# Patient Record
Sex: Female | Born: 1965 | Race: Black or African American | Hispanic: No | Marital: Single | Smoking: Never smoker
Health system: Southern US, Community
[De-identification: ages and names within clinical notes are randomized; demographics above are authoritative.]

## PROBLEM LIST (undated history)

## (undated) DIAGNOSIS — K219 Gastro-esophageal reflux disease without esophagitis: Secondary | ICD-10-CM

## (undated) DIAGNOSIS — D259 Leiomyoma of uterus, unspecified: Secondary | ICD-10-CM

## (undated) DIAGNOSIS — M8580 Other specified disorders of bone density and structure, unspecified site: Secondary | ICD-10-CM

## (undated) DIAGNOSIS — J329 Chronic sinusitis, unspecified: Secondary | ICD-10-CM

## (undated) DIAGNOSIS — Z780 Asymptomatic menopausal state: Secondary | ICD-10-CM

## (undated) HISTORY — DX: Asymptomatic menopausal state: Z78.0

## (undated) HISTORY — PX: TOTAL ABDOMINAL HYSTERECTOMY: SHX209

## (undated) HISTORY — DX: Other specified disorders of bone density and structure, unspecified site: M85.80

## (undated) HISTORY — PX: BILATERAL SALPINGOOPHORECTOMY: SHX1223

## (undated) HISTORY — DX: Leiomyoma of uterus, unspecified: D25.9

## (undated) HISTORY — DX: Chronic sinusitis, unspecified: J32.9

## (undated) HISTORY — DX: Gastro-esophageal reflux disease without esophagitis: K21.9

## (undated) HISTORY — PX: APPENDECTOMY: SHX54

## (undated) HISTORY — PX: BLADDER SURGERY: SHX569

---

## 2006-05-25 ENCOUNTER — Ambulatory Visit: Payer: Self-pay | Admitting: Family Medicine

## 2006-05-25 ENCOUNTER — Encounter: Payer: Self-pay | Admitting: Family Medicine

## 2006-07-20 ENCOUNTER — Ambulatory Visit: Payer: Self-pay | Admitting: Family Medicine

## 2007-03-17 ENCOUNTER — Ambulatory Visit: Payer: Self-pay | Admitting: Gastroenterology

## 2007-03-23 ENCOUNTER — Emergency Department: Payer: Self-pay | Admitting: Emergency Medicine

## 2007-03-24 ENCOUNTER — Ambulatory Visit: Payer: Self-pay | Admitting: Gastroenterology

## 2007-03-30 ENCOUNTER — Ambulatory Visit: Payer: Self-pay | Admitting: Gastroenterology

## 2007-05-30 ENCOUNTER — Ambulatory Visit: Payer: Self-pay | Admitting: Gynecology

## 2008-02-06 ENCOUNTER — Ambulatory Visit: Payer: Self-pay | Admitting: Gastroenterology

## 2008-04-06 ENCOUNTER — Ambulatory Visit: Payer: Self-pay | Admitting: Gastroenterology

## 2008-04-09 ENCOUNTER — Ambulatory Visit: Payer: Self-pay | Admitting: Gastroenterology

## 2008-04-19 ENCOUNTER — Ambulatory Visit: Payer: Self-pay | Admitting: Gastroenterology

## 2008-11-05 IMAGING — CT CT ABD-PELV W/ CM
1 of 3 series · 13 of 32 positions shown, 19 images · non-contrast
Comparison: none

REASON FOR EXAM: abdominal pain
COMMENTS:

[Series 2: soft tissue · axial · 0.63mm/px · z∈[-1274,-954]mm · 13 of 48 slices shown, 19 images]
[im 4/48  soft-tissue]
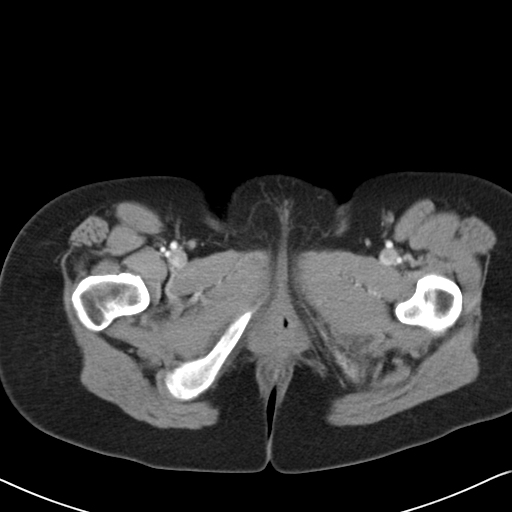
[im 4/48  bone]
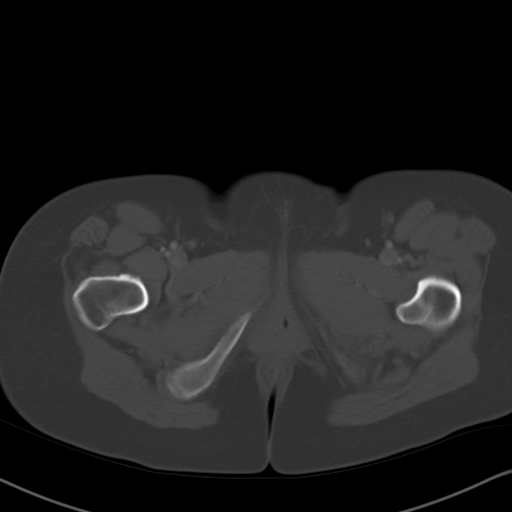
[im 7/48  soft-tissue]
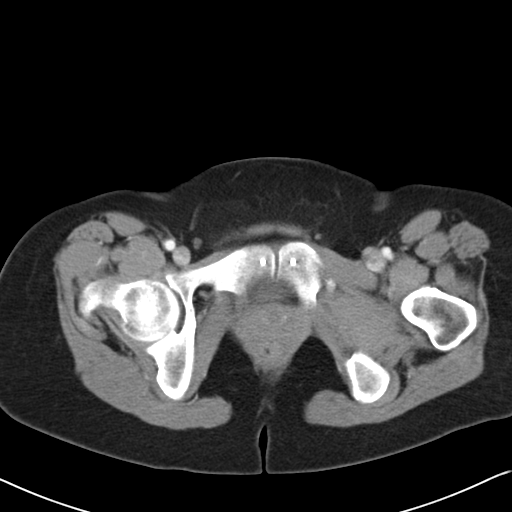
[im 10/48  soft-tissue]
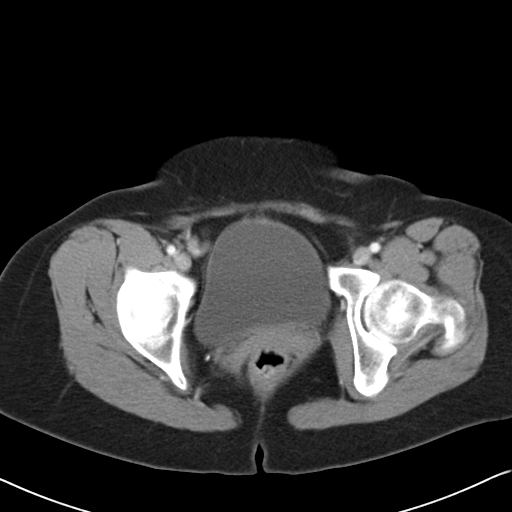
[im 13/48  soft-tissue]
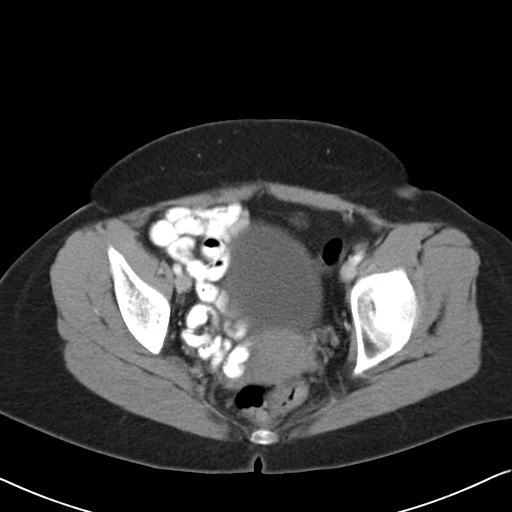
[im 16/48  soft-tissue]
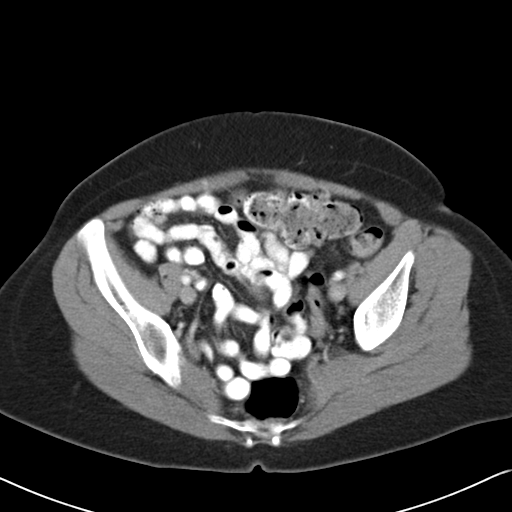
[im 19/48  soft-tissue]
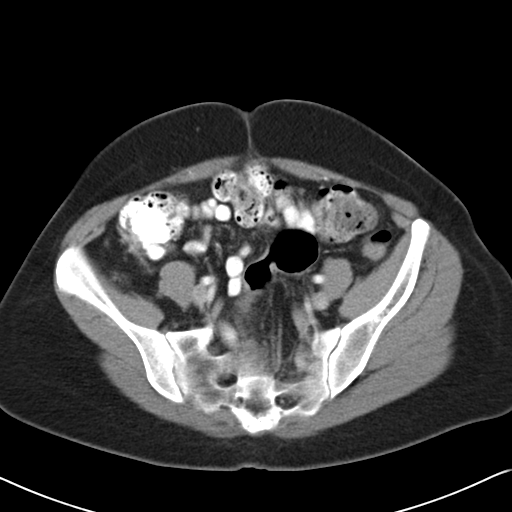
[im 26/48  soft-tissue]
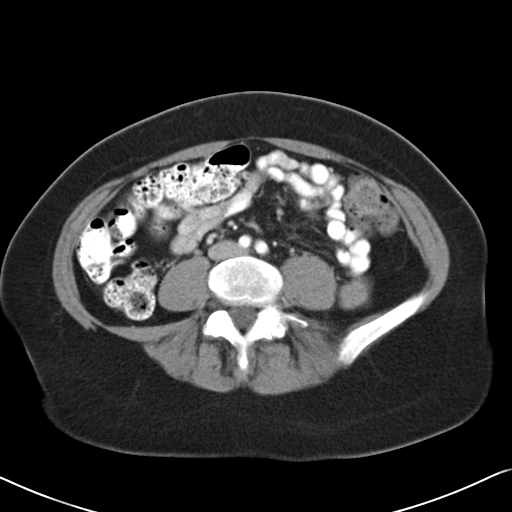
[im 29/48  soft-tissue]
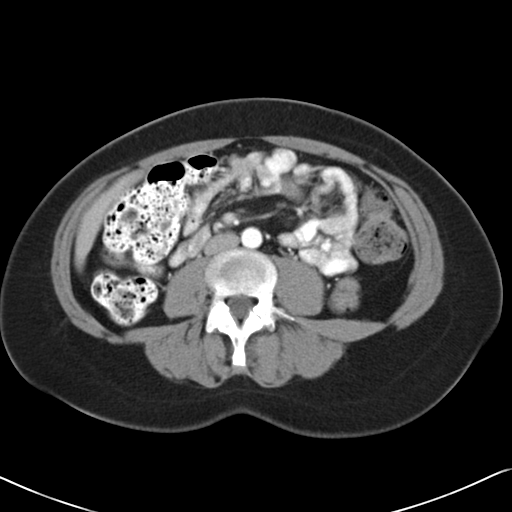
[im 32/48  soft-tissue]
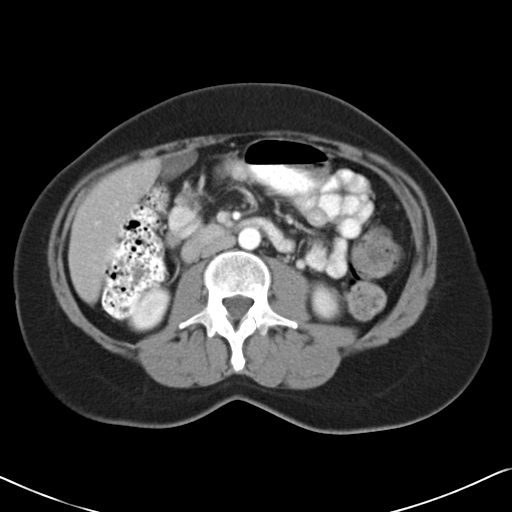
[im 32/48  bone]
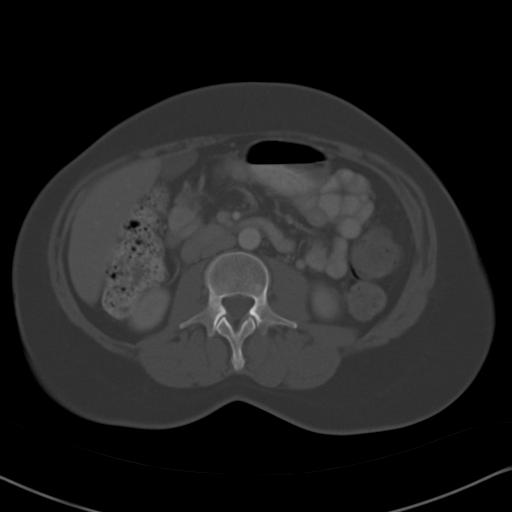
[im 35/48  soft-tissue]
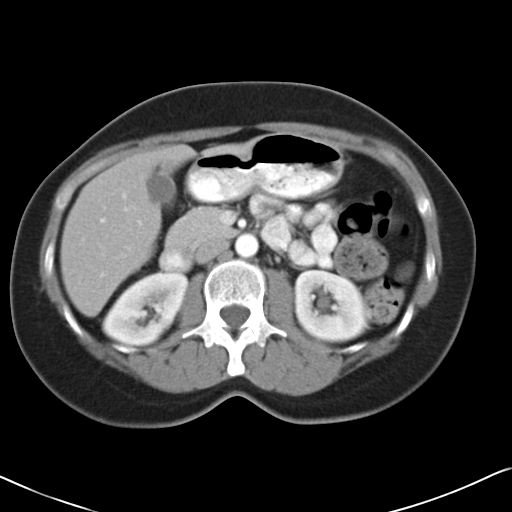
[im 35/48  lung]
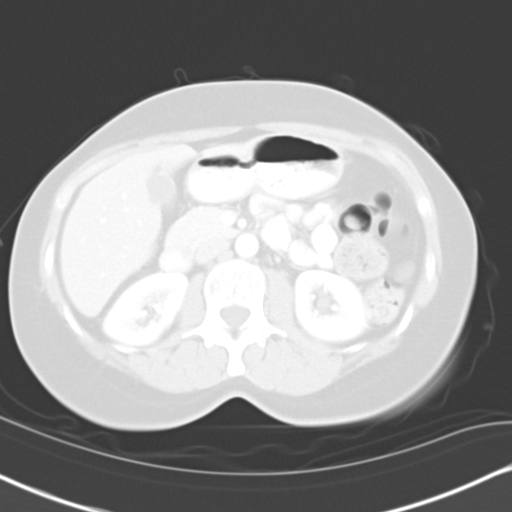
[im 38/48  soft-tissue]
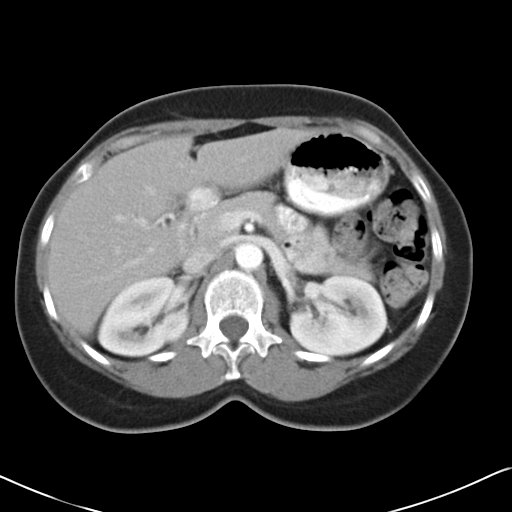
[im 38/48  lung]
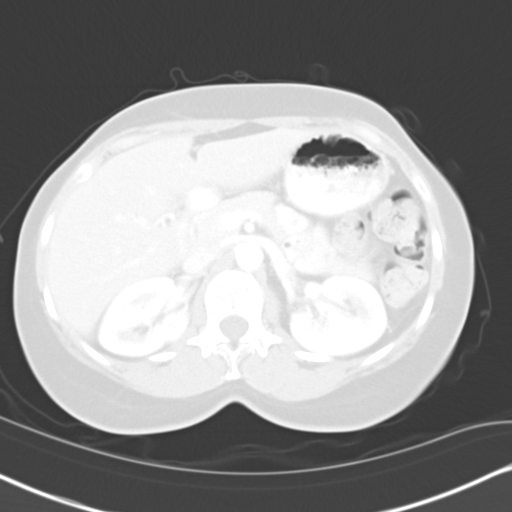
[im 41/48  soft-tissue]
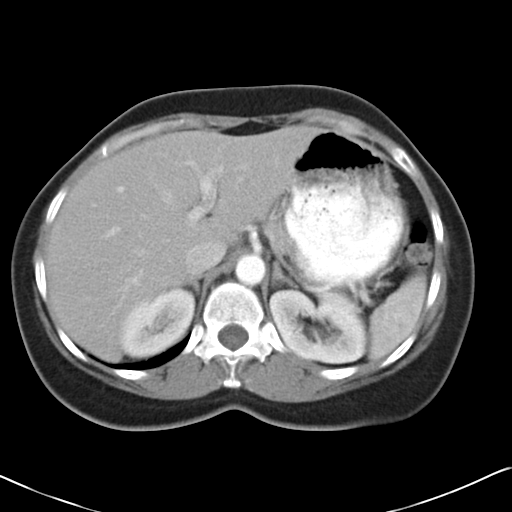
[im 41/48  lung]
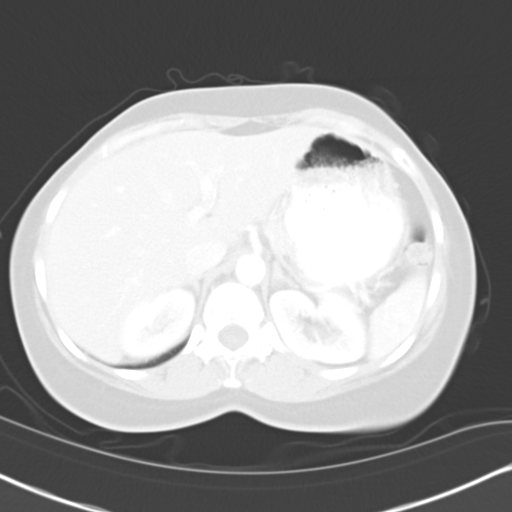
[im 44/48  soft-tissue]
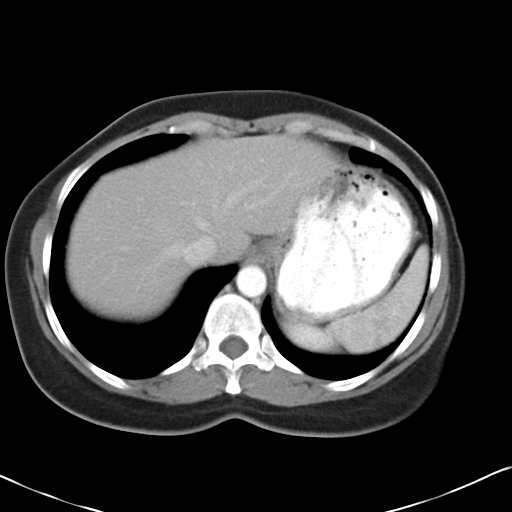
[im 44/48  lung]
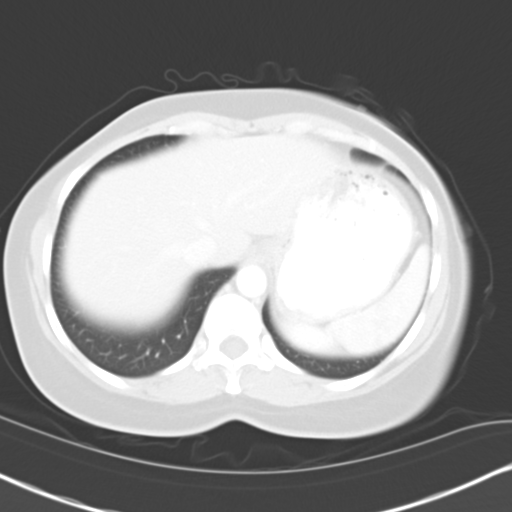

[13 of 32 positions shown; findings below may reference images not displayed]

PROCEDURE:     CT  - CT ABDOMEN / PELVIS  W  - March 17, 2007  [DATE]

RESULT:     CT of the abdomen and pelvis is performed utilizing 75 ml of
1sovue-KIN along with oral contrast. Images are reconstructed at 8 mm slice
thickness. The patient has no prior study for comparison. The patient has
been experiencing generalized abdominal pain with nausea. There is a history
of previous appendectomy and hysterectomy.

Low attenuation is seen in the RIGHT lobe of liver on image #7 with a
maximum measured dimension of 6.5 mm being present. Tiny density is seen in
the RIGHT lobe more posteriorly and medially near the RIGHT kidney upper
pole region and is even smaller and too small for accurate characterization.
Another low attenuation area is seen near the porta hepatis on image #10.
This area measures as much as 9 mm in length. Delayed images were obtained.
The low attenuation areas in the liver do not change and likely represent
simple hepatic cysts. The smaller areas are too small for accurate
characterization. The pancreas appears to be somewhat prominent. A discrete
pancreatic mass is not seen. Correlation with pancreatic enzymes is
recommended. This fullness extends into the pancreatic head. No evidence of
a definite parenchymal mass, cyst or pseudocyst is demonstrated. The
gallbladder, aorta, kidneys and spleen are within normal limits. The adrenal
glands are unremarkable. There is no abnormal bowel distention. There is no
free air or free fluid evident.
IMPRESSION: Presumed hepatic cysts. There is fullness diffusely of the
pancreas. If there is concern for pancreatic abnormality MRI could be
considered. There certainly could be some mild pancreatitis but no overt CT
evidence of acute pancreatitis is demonstrated.

## 2009-01-07 ENCOUNTER — Ambulatory Visit: Payer: Self-pay | Admitting: Obstetrics & Gynecology

## 2009-01-07 ENCOUNTER — Encounter: Payer: Self-pay | Admitting: Obstetrics & Gynecology

## 2009-06-10 ENCOUNTER — Ambulatory Visit: Payer: Self-pay | Admitting: Family Medicine

## 2009-11-29 ENCOUNTER — Ambulatory Visit: Payer: Self-pay

## 2010-07-01 ENCOUNTER — Ambulatory Visit: Payer: Self-pay | Admitting: Family Medicine

## 2010-07-02 ENCOUNTER — Ambulatory Visit: Payer: Self-pay

## 2010-11-11 NOTE — Assessment & Plan Note (Signed)
NAME:  ARLANDA, SHIPLETT NO.:  [id number]   MEDICAL RECORD NO.:  [id number]          PATIENT TYPE:  POB   LOCATION:  [facility] at [facility]         FACILITY:  [facility]   PHYSICIAN:  Jaynie Collins, MD     DATE OF BIRTH:  06/07/1966   DATE OF SERVICE:  01/07/2009                                  CLINIC NOTE   The patient is a 45 year old para 1, who comes in today for her yearly  examination.  The patient underwent hysterectomy in 2002 for fibroids  and at that point underwent a supracervical hysterectomy and bilateral  salpingo-oophorectomy.  She was also been diagnosed with osteoporosis on  a DEXA scan done in 2008, which showed osteoporosis of the lumbar spine  and osteopenia of the left femoral neck.  At her visit in January 2008,  she was prescribed Boniva 150 mg per month and also instructed on  weightbearing exercise and calcium and vitamin D.  The patient reports  that she had not been taking the Boniva, the calcium or the vitamin D  and does not exercise regularly.  She denies any gynecologic concerns.   PAST OBSTETRIC AND GYNECOLOGIC HISTORY:  The patient has had 1 cesarean  section.  She had a supracervical hysterectomy followed by a post-  hysterectomy infection that required a bilateral salpingo-oophorectomy.  The patient has had no bleeding, no sexually transmitted infections or  any other concerns.  Her last mammogram was in December 2007, which was  negative.   PAST MEDICAL HISTORY:  Gastroesophageal reflux disease and sinus  infections.   PAST SURGICAL HISTORY:  Supracervical hysterectomy and bilateral  salpingo-oophorectomy, bladder surgery, appendectomy.   MEDICATIONS:  1. Omeprazole 20 mg daily.  2. Cefdinir 300 mg b.i.d. for sinus infections.   ALLERGIES:  CODEINE, which causes GI upset.   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  She denies any tobacco, alcohol or drug use.  She denies any sexual or  physical abuse  history.   FAMILIAL HISTORY:  Notable for diabetes, hypertension, and elevated  cholesterol.   REVIEW OF SYSTEMS:  Entirely negative unless reported in the history of  present illness.   PHYSICAL EXAMINATION:  VITAL SIGNS:  Blood pressure is 111/73, pulse 84,  weight 138 pounds, height 5 feet 1 inches.  GENERAL:  No apparent distress.  HEENT:  Normocephalic, atraumatic.  NECK:  Supple with normal thyroid.  LUNGS:  Clear to auscultation bilaterally.  HEART:  Regular rate and rhythm.  BREASTS:  Symmetric in size.  No abnormal masses, tenderness, nipple  drainage or lymphadenopathy.  ABDOMEN:  Soft, nontender, nondistended, well-healed incisions.  No  masses.  No organomegaly.  EXTREMITIES:  No cyanosis, clubbing or edema.  PELVIC:  Normal external female genitalia.  Pink, well-rugated vagina.  Normal cervix.  Uterus and adnexa are surgically absent.  Bimanual exam  is normal.  Pap smear was obtained from the cervix.   ASSESSMENT AND PLAN:  The patient is a 45 year old para 1 with a history  of osteoporosis that is untreated, who comes in today for annual exam.  The patient had a normal breast examination, but we will  schedule a  mammogram.  She also underwent a Pap smear today.  We will follow up  results.  As given the patient has osteoporosis, she was strongly  advised to continue with her osteoporosis therapy with Boniva, which is  given monthly.  She was given a repeat prescription for this and also  told to continue calcium and vitamin D daily.  The patient is not having  any kind of menopausal symptoms and as well as not a candidate for  hormone replacement therapy.  At this point, she will have another DEXA  scan and we will see how her osteoporosis has progressed since her last  one in 2008.  The importance of adhering to osteoporosis therapy was  emphasized with the patient and risk of fracture if therapy is not  adhered to.  The patient was told to follow up for any  further concerns.            ______________________________  Jaynie Collins, MD     UA/MEDQ  D:  01/07/2009  T:  01/08/2009  Job:  [id number]

## 2010-11-14 NOTE — Assessment & Plan Note (Signed)
NAME:  NANCE, MCCOMBS NO.:  [id number]   MEDICAL RECORD NO.:  [id number]          PATIENT TYPE:  POB   LOCATION:  [facility]         FACILITY:  [facility]   PHYSICIAN:  Tinnie Gens, MD        DATE OF BIRTH:  [birth year]   DATE OF SERVICE:                                    CLINIC NOTE   CHIEF COMPLAINT:  Yearly exam.   HISTORY OF PRESENT ILLNESS:  The patient is a 45 year old para 1 who comes  in today for her yearly exam.  She is status post hysterectomy in 2002.  Her  last mammogram was in 2006.  She is without significant complaint today.  The patient underwent bone densitometry at work that showed a Z-score of -  1.5 and a T-score of -1.7.  She lost her ovaries after her hysterectomy in  2001 and has been menopausal for at least 6 years.  She is supposed to be on  Vivelle, which she does not take with any regularity.  Is also not taking  any vitamin D or calcium.   PAST MEDICAL HISTORY:  Negative.   PAST SURGICAL HISTORY:  She had a TAH/BSO, fibroids, bladder surgery, and an  appendectomy.   MEDICATIONS:  She is on Vivelle-Dot 0.1, which she takes irregularly.   ALLERGIES:  No known.   SOCIAL HISTORY:  No tobacco, alcohol, or drug use.  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   FAMILY HISTORY:  Diabetes in her father.  Hypertension.  Elevated  cholesterol in her mother.   OB HISTORY:  She is a para 1.  She had 1 C-section.   GYN HISTORY:  History of fibroid uterus, where she had a hysterectomy,  followed by post-hysterectomy infection that required a BSO.   REVIEW OF SYSTEMS:  Reviewed.  Positive for headaches.  She is not sure if  this is related to sinus problems, which are recurrent for her, or the fact  that she is having difficulty seeing up close any more.  She did report she  is seeing an eye doctor next month.  She denies shortness of breath, chest  pain, coughing, wheezing, nausea, vomiting, or diarrhea, constipation,  weight loss, weight gain, anorexia.  She reports being somewhat constipated  and would like to increase the fiber in her diet.  No blood in her stools.  No diarrhea.  No dysuria.  No hematuria.  No lower extremity swelling.  No  neurological issues.   EXAM:  Blood pressure is 103/62, weight 134, pulse 74.  She is a well-developed, well-nourished female in no acute distress.  NECK:  Supple with normal thyroid.  HEENT:  Sclerae anicteric.  LUNGS:  Clear bilaterally.  CV:  Regular rate and rhythm with no rubs, gallops, or murmurs.  ABDOMEN:  Soft, nontender, nondistended.  No masses.  No organomegaly.  EXTREMITIES:  No cyanosis, clubbing, or edema.  2+ distal pulses.  BREASTS:  Symmetric with everted nipples.  No masses.  No supraclavicular or  axillary adenopathy.  GU:  Normal external female genitalia.  Vagina is pink and rugae.  The  cervix is present and nulliparas  in nature.  The uterus and adnexa are  surgically absent.  BUS is normal.   IMPRESSION:  1. Yearly exam with Pap smear.  Given that the patient continues to have      cervix, still recommend yearly Pap smear.  If she goes 2 to 3 years      with normal Pap smear, she could switch to every 3 years.  2. Osteopenia.  3. Menopause.   PLAN:  1. Mammogram next summer.  Bone density test.  2. Refill Vivelle 0.1, take more regularly.  3. Calcium 1500 plus vitamin D 400 daily.  Using primrose oil for      occasional breast discomfort.  4. Recommend seeing her eye doctor.  5. Fibercon as needed for constipation.           ______________________________  Tinnie Gens, MD     TP/MEDQ  D:  05/25/2006  T:  05/25/2006  Job:  [id number]

## 2010-11-14 NOTE — Assessment & Plan Note (Signed)
NAME:  Debra Price, Debra Price NO.:  [id number]   MEDICAL RECORD NO.:  [id number]          PATIENT TYPE:  POB   LOCATION:  [facility] at [facility]         FACILITY:  [facility]   PHYSICIAN:  Tinnie Gens, MD        DATE OF BIRTH:  1965-12-21   DATE OF SERVICE:  07/20/2006                                  CLINIC NOTE   CHIEF COMPLAINT:  Followup.   HISTORY OF PRESENT ILLNESS:  Patient is a 45 year old lady who had a  DEXA scan at work and was found to have osteopenia.  She returns today  after results of her bone density scan have been completed.  This  reveals an osteoporosis of the lumbar spine, osteopenia of the left  femoral neck shows a T score of -3.06 and a Z score of -2.7 at the  lumbar spine, and a T score of -1.97 and a Z score of -1.76 at the left  femoral neck.  Patient comes in today to inquire about treatment.  She  is without significant complaint.  She did start on calcium and vitamin  D as instructed at her last visit.   PHYSICAL EXAMINATION:  On exam, her vitals are as noted on the chart.  She is a well-developed, well-nourished female in no acute distress.   IMPRESSION:  Osteoporosis and osteopenia.   PLAN:  1. Start Boniva 150 mg once monthly.  Instructions were given to the      patient regarding n.p.o. for 60 minutes after taking, and sitting      upright after taking the medication.  2. Followup bone density in approximately 12 months.  3. Instructed on weight-bearing exercise.  4. Continue calcium and vitamin D.  5. Consider followup at 3 months just to see how she is tolerating the      medication.           ______________________________  Tinnie Gens, MD     TP/MEDQ  D:  07/20/2006  T:  07/20/2006  Job:  [id number]

## 2011-02-18 ENCOUNTER — Emergency Department: Payer: Self-pay | Admitting: *Deleted

## 2011-04-02 ENCOUNTER — Emergency Department: Payer: Self-pay | Admitting: Emergency Medicine

## 2011-09-17 ENCOUNTER — Ambulatory Visit: Payer: Self-pay

## 2011-11-16 ENCOUNTER — Ambulatory Visit: Payer: Self-pay | Admitting: Family Medicine

## 2011-11-16 DIAGNOSIS — Z01419 Encounter for gynecological examination (general) (routine) without abnormal findings: Secondary | ICD-10-CM

## 2011-12-18 ENCOUNTER — Other Ambulatory Visit: Payer: Self-pay | Admitting: Internal Medicine
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
## 2016-10-12 ENCOUNTER — Other Ambulatory Visit: Payer: Self-pay | Admitting: Gastroenterology

## 2016-10-12 DIAGNOSIS — Z8719 Personal history of other diseases of the digestive system: Secondary | ICD-10-CM

## 2016-10-12 DIAGNOSIS — R1011 Right upper quadrant pain: Secondary | ICD-10-CM

## 2016-10-12 DIAGNOSIS — R1013 Epigastric pain: Secondary | ICD-10-CM

## 2016-10-12 DIAGNOSIS — K769 Liver disease, unspecified: Secondary | ICD-10-CM

## 2016-10-19 ENCOUNTER — Ambulatory Visit: Payer: Self-pay

## 2018-12-29 ENCOUNTER — Emergency Department
Admission: EM | Admit: 2018-12-29 | Discharge: 2018-12-29 | Disposition: A | Discharge status: Home / Self Care | Payer: No Typology Code available for payment source | Attending: Emergency Medicine | Admitting: Emergency Medicine

## 2018-12-29 ENCOUNTER — Encounter: Payer: Self-pay | Admitting: *Deleted

## 2018-12-29 ENCOUNTER — Emergency Department: Payer: No Typology Code available for payment source

## 2018-12-29 ENCOUNTER — Other Ambulatory Visit: Payer: Self-pay

## 2018-12-29 DIAGNOSIS — Y998 Other external cause status: Secondary | ICD-10-CM | POA: Insufficient documentation

## 2018-12-29 DIAGNOSIS — S0990XA Unspecified injury of head, initial encounter: Secondary | ICD-10-CM | POA: Diagnosis present

## 2018-12-29 DIAGNOSIS — Y92410 Unspecified street and highway as the place of occurrence of the external cause: Secondary | ICD-10-CM | POA: Diagnosis not present

## 2018-12-29 DIAGNOSIS — Y9389 Activity, other specified: Secondary | ICD-10-CM | POA: Insufficient documentation

## 2018-12-29 DIAGNOSIS — S59912A Unspecified injury of left forearm, initial encounter: Secondary | ICD-10-CM | POA: Insufficient documentation

## 2018-12-29 DIAGNOSIS — S3992XA Unspecified injury of lower back, initial encounter: Secondary | ICD-10-CM | POA: Insufficient documentation

## 2018-12-29 MED ORDER — METHOCARBAMOL 500 MG PO TABS: 500.0000 mg | ORAL_TABLET | Freq: Three times a day (TID) | ORAL | 0 refills | Status: AC | PRN
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
